# Patient Record
Sex: Male | Born: 1985 | Race: Black or African American | Hispanic: No | Marital: Single | Smoking: Current every day smoker
Health system: Southern US, Community
[De-identification: ages and names within clinical notes are randomized; demographics above are authoritative.]

## PROBLEM LIST (undated history)

## (undated) HISTORY — PX: TONSILLECTOMY: SUR1361

---

## 2011-12-03 ENCOUNTER — Emergency Department (HOSPITAL_BASED_OUTPATIENT_CLINIC_OR_DEPARTMENT_OTHER)
Admission: EM | Admit: 2011-12-03 | Discharge: 2011-12-03 | Disposition: A | Discharge status: Home / Self Care | Payer: Self-pay | Attending: Emergency Medicine | Admitting: Emergency Medicine

## 2011-12-03 ENCOUNTER — Other Ambulatory Visit: Payer: Self-pay

## 2011-12-03 ENCOUNTER — Encounter (HOSPITAL_BASED_OUTPATIENT_CLINIC_OR_DEPARTMENT_OTHER): Payer: Self-pay | Admitting: *Deleted

## 2011-12-03 DIAGNOSIS — R110 Nausea: Secondary | ICD-10-CM | POA: Insufficient documentation

## 2011-12-03 DIAGNOSIS — F17200 Nicotine dependence, unspecified, uncomplicated: Secondary | ICD-10-CM | POA: Insufficient documentation

## 2011-12-03 DIAGNOSIS — R42 Dizziness and giddiness: Secondary | ICD-10-CM | POA: Insufficient documentation

## 2011-12-03 LAB — COMPREHENSIVE METABOLIC PANEL
ALT: 44 U/L (ref 0–53)
Alkaline Phosphatase: 90 U/L (ref 39–117)
CO2: 28 mEq/L (ref 19–32)
Chloride: 101 mEq/L (ref 96–112)
GFR calc Af Amer: >90 mL/min (ref >90)
Glucose, Bld: 98 mg/dL (ref 70–99)
Potassium: 3.8 mEq/L (ref 3.5–5.1)
Sodium: 139 mEq/L (ref 135–145)
Total Bilirubin: 0.3 mg/dL (ref 0.3–1.2)
Total Protein: 7.5 g/dL (ref 6.0–8.3)

## 2011-12-03 LAB — CBC
Hemoglobin: 14.2 g/dL (ref 13.0–17.0)
Platelets: 213 10*3/uL (ref 150–400)
RBC: 4.93 MIL/uL (ref 4.22–5.81)
WBC: 6.1 10*3/uL (ref 4.0–10.5)

## 2011-12-03 LAB — DIFFERENTIAL
Eosinophils Absolute: 0.1 10*3/uL (ref 0.0–0.7)
Lymphocytes Relative: 24 % (ref 12–46)
Lymphs Abs: 1.5 10*3/uL (ref 0.7–4.0)
Neutro Abs: 4 10*3/uL (ref 1.7–7.7)
Neutrophils Relative %: 65 % (ref 43–77)

## 2011-12-03 MED ORDER — ONDANSETRON HCL 4 MG/2ML IJ SOLN
4.0000 mg | Freq: Once | INTRAMUSCULAR | Status: AC
  Administered 2011-12-03: 4 mg via INTRAVENOUS
  Filled 2011-12-03: qty 2

## 2011-12-03 MED ORDER — ONDANSETRON HCL 4 MG PO TABS: 4.0000 mg | ORAL_TABLET | Freq: Four times a day (QID) | ORAL | Status: AC

## 2011-12-03 MED ORDER — SODIUM CHLORIDE 0.9 % IV BOLUS (SEPSIS)
1000.0000 mL | Freq: Once | INTRAVENOUS | Status: AC
  Administered 2011-12-03: 1000 mL via INTRAVENOUS

## 2011-12-03 NOTE — ED Provider Notes (Signed)
History     CSN: [id number]  Arrival date & time 12/03/11  1622   First MD Initiated Contact with Patient 12/03/11 1640      Chief Complaint  Patient presents with  . Dizziness  . Nausea   patient presents with dizziness and nausea for one hour. His girlfriend is currently sick with "stomach flu." She has been having vomiting and diarrhea. All day and left work. However, she did not check in as a patient. Patient states he feels like he wants to followup but has not feels queasy and lightheaded. He has had no diarrhea, no vomiting, no fever, no chest pain, or abdominal pain. Just feels a sensation that he is about to vomit. Denies any other sick contacts other than his girlfriend. He has no significant past medical history no recent travel. Denies any recent change in diet  (Consider location/radiation/quality/duration/timing/severity/associated sxs/prior treatment) HPI  History reviewed. No pertinent past medical history.  Past Surgical History  Procedure Date  . Tonsillectomy     History reviewed. No pertinent family history.  History  Substance Use Topics  . Smoking status: Current Some Day Smoker  . Smokeless tobacco: Not on file  . Alcohol Use: No      Review of Systems  All other systems reviewed and are negative.    Allergies  Review of patient's allergies indicates no known allergies.  Home Medications  No current outpatient prescriptions on file.  BP 143/78  Pulse 102  Temp(Src) 99 F (37.2 C) (Oral)  Resp 18  Ht 5\' 8"  (1.727 m)  Wt 222 lb (100.699 kg)  BMI 33.76 kg/m2  SpO2 96%  Physical Exam  Nursing note and vitals reviewed. Constitutional: He is oriented to person, place, and time. He appears well-developed and well-nourished.  HENT:  Head: Normocephalic and atraumatic.  Eyes: Conjunctivae and EOM are normal. Pupils are equal, round, and reactive to light.  Neck: Neck supple.  Cardiovascular: Normal rate and regular rhythm.  Exam reveals no  gallop and no friction rub.   No murmur heard. Pulmonary/Chest: Breath sounds normal. He has no wheezes. He has no rales. He exhibits no tenderness.  Abdominal: Soft. Bowel sounds are normal. He exhibits no distension. There is no tenderness. There is no rebound and no guarding.  Musculoskeletal: Normal range of motion.  Neurological: He is alert and oriented to person, place, and time. No cranial nerve deficit. Coordination normal.  Skin: Skin is warm and dry. No rash noted.  Psychiatric: He has a normal mood and affect.    ED Course  Procedures (including critical care time)   Labs Reviewed  CBC  DIFFERENTIAL  COMPREHENSIVE METABOLIC PANEL   No results found.   No diagnosis found.    MDM  Patient is seen and examined, initial history and physical is completed. Evaluation initiated      6:43 PM   Date: 12/03/2011  Rate: 88  Rhythm: normal sinus rhythm  QRS Axis: normal  Intervals: normal  ST/T Wave abnormalities: normal  Conduction Disutrbances:none  Narrative Interpretation:   Old EKG Reviewed: none available   Results for orders placed during the hospital encounter of 12/03/11  CBC      Component Value Range   WBC 6.1  4.0 - 10.5 (K/uL)   RBC 4.93  4.22 - 5.81 (MIL/uL)   Hemoglobin 14.2  13.0 - 17.0 (g/dL)   HCT 40.9  81.1 - 91.4 (%)   MCV 82.4  78.0 - 100.0 (fL)   MCH 28.8  26.0 - 34.0 (pg)   MCHC 35.0  30.0 - 36.0 (g/dL)   RDW 16.1  09.6 - 04.5 (%)   Platelets 213  150 - 400 (K/uL)  DIFFERENTIAL      Component Value Range   Neutrophils Relative 65  43 - 77 (%)   Neutro Abs 4.0  1.7 - 7.7 (K/uL)   Lymphocytes Relative 24  12 - 46 (%)   Lymphs Abs 1.5  0.7 - 4.0 (K/uL)   Monocytes Relative 10  3 - 12 (%)   Monocytes Absolute 0.6  0.1 - 1.0 (K/uL)   Eosinophils Relative 1  0 - 5 (%)   Eosinophils Absolute 0.1  0.0 - 0.7 (K/uL)   Basophils Relative 0  0 - 1 (%)   Basophils Absolute 0.0  0.0 - 0.1 (K/uL)  COMPREHENSIVE METABOLIC PANEL       Component Value Range   Sodium 139  135 - 145 (mEq/L)   Potassium 3.8  3.5 - 5.1 (mEq/L)   Chloride 101  96 - 112 (mEq/L)   CO2 28  19 - 32 (mEq/L)   Glucose, Bld 98  70 - 99 (mg/dL)   BUN 10  6 - 23 (mg/dL)   Creatinine, Ser 4.09  0.50 - 1.35 (mg/dL)   Calcium 9.7  8.4 - 81.1 (mg/dL)   Total Protein 7.5  6.0 - 8.3 (g/dL)   Albumin 4.3  3.5 - 5.2 (g/dL)   AST 21  0 - 37 (U/L)   ALT 44  0 - 53 (U/L)   Alkaline Phosphatase 90  39 - 117 (U/L)   Total Bilirubin 0.3  0.3 - 1.2 (mg/dL)   GFR calc non Af Amer >90  >90 (mL/min)   GFR calc Af Amer >90  >90 (mL/min)   No results found.  Stable for dc home.     Finnbar Cedillos A. Patrica Duel, MD 12/03/11 1843

## 2011-12-03 NOTE — ED Notes (Addendum)
Patient states that earlier he had sudden onset of nausea/dizziness. No dizziness at this time, able to walk w/o difficulty, no neuro deficits, still experiencing nausea

## 2011-12-03 NOTE — ED Notes (Signed)
Pt c/o dizzy and nausea x 1 hr

## 2012-12-27 ENCOUNTER — Encounter (HOSPITAL_BASED_OUTPATIENT_CLINIC_OR_DEPARTMENT_OTHER): Payer: Self-pay | Admitting: *Deleted

## 2012-12-27 ENCOUNTER — Emergency Department (HOSPITAL_BASED_OUTPATIENT_CLINIC_OR_DEPARTMENT_OTHER)
Admission: EM | Admit: 2012-12-27 | Discharge: 2012-12-27 | Disposition: A | Discharge status: Home / Self Care | Payer: Self-pay | Attending: Emergency Medicine | Admitting: Emergency Medicine

## 2012-12-27 ENCOUNTER — Emergency Department (HOSPITAL_BASED_OUTPATIENT_CLINIC_OR_DEPARTMENT_OTHER): Payer: Self-pay

## 2012-12-27 DIAGNOSIS — Z87891 Personal history of nicotine dependence: Secondary | ICD-10-CM | POA: Insufficient documentation

## 2012-12-27 DIAGNOSIS — J029 Acute pharyngitis, unspecified: Secondary | ICD-10-CM | POA: Insufficient documentation

## 2012-12-27 DIAGNOSIS — J3489 Other specified disorders of nose and nasal sinuses: Secondary | ICD-10-CM | POA: Insufficient documentation

## 2012-12-27 DIAGNOSIS — R5381 Other malaise: Secondary | ICD-10-CM | POA: Insufficient documentation

## 2012-12-27 DIAGNOSIS — R51 Headache: Secondary | ICD-10-CM | POA: Insufficient documentation

## 2012-12-27 DIAGNOSIS — R109 Unspecified abdominal pain: Secondary | ICD-10-CM | POA: Insufficient documentation

## 2012-12-27 DIAGNOSIS — IMO0001 Reserved for inherently not codable concepts without codable children: Secondary | ICD-10-CM | POA: Insufficient documentation

## 2012-12-27 DIAGNOSIS — R52 Pain, unspecified: Secondary | ICD-10-CM | POA: Insufficient documentation

## 2012-12-27 MED ORDER — HYDROCODONE-HOMATROPINE 5-1.5 MG/5ML PO SYRP: 5.0000 mL | ORAL_SOLUTION | Freq: Four times a day (QID) | ORAL | Status: AC | PRN

## 2012-12-27 NOTE — ED Notes (Signed)
Cough, headache, body aches x 2 days.

## 2012-12-27 NOTE — ED Notes (Signed)
MD at bedside. 

## 2012-12-27 NOTE — ED Provider Notes (Signed)
History  This chart was scribed for Gerhard Munch, MD by Bennett Scrape, ED Scribe. This patient was seen in room MH10/MH10 and the patient's care was started at 8:27 PM.  CSN: [id number]  Arrival date & time 12/27/12  1945   First MD Initiated Contact with Patient 12/27/12 2027      Chief Complaint  Patient presents with  . Cough     The history is provided by the patient. No language interpreter was used.    Zachary Skinner is a 27 y.o. male who presents to the Emergency Department complaining of 5 days of gradual onset, gradually worsening, constant sore throat with associated intermittent HAs, non-productive cough, myalgias, fatigue and lower abdominal pain. He reports taking Nyquil and Robitussin with mild improvement. He denies any sick contacts with similar symptoms. He denies SOB, fevers, chills, nausea, emesis, diarrhea, confusion, leg swelling, urinary symptoms and rash as associated symptoms. He does not have a h/o chronic medical conditions and is a former smoker but denies alcohol use.     History reviewed. No pertinent past medical history.  Past Surgical History  Procedure Laterality Date  . Tonsillectomy      No family history on file.  History  Substance Use Topics  . Smoking status: Former Games developer  . Smokeless tobacco: Not on file  . Alcohol Use: No      Review of Systems  Constitutional:       Per HPI, otherwise negative  HENT:       Per HPI, otherwise negative  Respiratory:       Per HPI, otherwise negative  Cardiovascular:       Per HPI, otherwise negative  Gastrointestinal: Positive for abdominal pain. Negative for nausea, vomiting and diarrhea.  Endocrine:       Negative aside from HPI  Genitourinary:       Neg aside from HPI   Musculoskeletal:       Per HPI, otherwise negative  Skin: Negative.   Neurological: Positive for headaches. Negative for syncope.    Allergies  Review of patient's allergies indicates no known  allergies.  Home Medications  No current outpatient prescriptions on file.  Triage Vitals: BP 125/80  Pulse 71  Temp(Src) 98.9 F (37.2 C) (Oral)  Resp 22  SpO2 99%  Physical Exam  Nursing note and vitals reviewed. Constitutional: He is oriented to person, place, and time. He appears well-developed. No distress.  HENT:  Head: Normocephalic and atraumatic.  Mouth/Throat: Oropharynx is clear and moist.  Eyes: Conjunctivae and EOM are normal.  Cardiovascular: Normal rate and regular rhythm.   Pulmonary/Chest: Effort normal. No stridor. No respiratory distress.  Mildly diminished breath sounds on the right  Abdominal: He exhibits no distension.  Musculoskeletal: He exhibits no edema.  Lymphadenopathy:    He has no cervical adenopathy.  Neurological: He is alert and oriented to person, place, and time.  Skin: Skin is warm and dry.  Psychiatric: He has a normal mood and affect.    ED Course  Procedures (including critical care time)  DIAGNOSTIC STUDIES: Oxygen Saturation is 99% on room air, normal by my interpretation.    COORDINATION OF CARE: 8:42 PM-Advised pt that his symptoms are mostly likely viral and had a lengthy discussion with the pt as to why Theraflu would not apply in his case. Discussed treatment plan which includes CXR with pt at bedside and pt agreed to plan.   Labs Reviewed - No data to display No results found.  No diagnosis found.  I interpreted the x-ray, which is unremarkable.  MDM   I personally performed the services described in this documentation, which was scribed in my presence. The recorded information has been reviewed and is accurate.   This young male presents with several days of cough, congestion, generalized discomfort.  On exam the patient is afebrile, with unremarkable vital signs, little suspicion for acute systemic illness.  The patient's x-ray does not demonstrate pneumonia, and although he may have an influenza-like illness, the  passage of time since that his symptoms did not make him a candidate for antiviral.  His discharge stable condition after a discussion on return precautions.  Gerhard Munch, MD 12/27/12 2141

## 2015-01-11 ENCOUNTER — Encounter (HOSPITAL_BASED_OUTPATIENT_CLINIC_OR_DEPARTMENT_OTHER): Payer: Self-pay | Admitting: Emergency Medicine

## 2015-01-11 ENCOUNTER — Emergency Department (HOSPITAL_BASED_OUTPATIENT_CLINIC_OR_DEPARTMENT_OTHER)
Admission: EM | Admit: 2015-01-11 | Discharge: 2015-01-11 | Disposition: A | Discharge status: Home / Self Care | Payer: Managed Care, Other (non HMO) | Attending: Emergency Medicine | Admitting: Emergency Medicine

## 2015-01-11 DIAGNOSIS — H1131 Conjunctival hemorrhage, right eye: Secondary | ICD-10-CM | POA: Insufficient documentation

## 2015-01-11 DIAGNOSIS — Z862 Personal history of diseases of the blood and blood-forming organs and certain disorders involving the immune mechanism: Secondary | ICD-10-CM | POA: Insufficient documentation

## 2015-01-11 DIAGNOSIS — Z87891 Personal history of nicotine dependence: Secondary | ICD-10-CM | POA: Insufficient documentation

## 2015-01-11 NOTE — Discharge Instructions (Signed)
Subconjunctival Hemorrhage °A subconjunctival hemorrhage is a bright red patch covering a portion of the white of the eye. The white part of the eye is called the sclera, and it is covered by a thin membrane called the conjunctiva. This membrane is clear, except for tiny blood vessels that you can see with the naked eye. When your eye is irritated or inflamed and becomes red, it is because the vessels in the conjunctiva are swollen. °Sometimes, a blood vessel in the conjunctiva can break and bleed. When this occurs, the blood builds up between the conjunctiva and the sclera, and spreads out to create a red area. The red spot may be very small at first. It may then spread to cover a larger part of the surface of the eye, or even all of the visible white part of the eye. °In almost all cases, the blood will go away and the eye will become white again. Before completely dissolving, however, the red area may spread. It may also become brownish-yellow in color before going away. If a lot of blood collects under the conjunctiva, it may look like a bulge on the surface of the eye. This looks scary, but it will also eventually flatten out and go away. Subconjunctival hemorrhages do not cause pain, but if swollen, may cause a feeling of irritation. There is no effect on vision.  °CAUSES  °· The most common cause is mild trauma (rubbing the eye, irritation). °· Subconjunctival hemorrhages can happen because of coughing or straining (lifting heavy objects), vomiting, or sneezing. °· In some cases, your doctor may want to check your blood pressure. High blood pressure can also cause a subconjunctival hemorrhage. °· Severe trauma or blunt injuries. °· Diseases that affect blood clotting (hemophilia, leukemia). °· Abnormalities of blood vessels behind the eye (carotid cavernous sinus fistula). °· Tumors behind the eye. °· Certain drugs (aspirin, Coumadin, heparin). °· Recent eye surgery. °HOME CARE INSTRUCTIONS  °· Do not worry  about the appearance of your eye. You may continue your usual activities. °· Often, follow-up is not necessary. °SEEK MEDICAL CARE IF:  °· Your eye becomes painful. °· The bleeding does not disappear within 3 weeks. °· Bleeding occurs elsewhere, for example, under the skin, in the mouth, or in the other eye. °· You have recurring subconjunctival hemorrhages. °SEEK IMMEDIATE MEDICAL CARE IF:  °· Your vision changes or you have difficulty seeing. °· You develop a severe headache, persistent vomiting, confusion, or abnormal drowsiness (lethargy). °· Your eye seems to bulge or protrude from the eye socket. °· You notice the sudden appearance of bruises or have spontaneous bleeding elsewhere on your body. °Document Released: 10/27/2005 Document Revised: 03/13/2014 Document Reviewed: 09/24/2009 °[employer]® Patient Information ©2015 [employer]. This information is not intended to replace advice given to you by your health care provider. Make sure you discuss any questions you have with your health care provider. ° °

## 2015-01-11 NOTE — ED Notes (Signed)
Pt c/o right eye redness. States he woke up with the eye being red. Denies drainage or injury and denies Pain. Pt a/o NAD at triage.

## 2015-01-11 NOTE — ED Provider Notes (Signed)
CSN: [id number]     Arrival date & time 01/11/15  1306 History   First MD Initiated Contact with Patient 01/11/15 1503     Chief Complaint  Patient presents with  . Eye Problem     (Consider location/radiation/quality/duration/timing/severity/associated sxs/prior Treatment) HPI  29 year old male presents with right eye redness that he knows when he woke up this morning. His pain, blurry vision, tearing, or swelling associated with this. He states the right redness of the red has decreased throughout the day. The patient has a history of sickle cell trait but no specific sickle cell anemia. He denies any recent cough or infection. He does not wear contacts or glasses. 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 Yesterday he does remember straining particularly hard last night. Denies any eye pain or symptoms at the time.  History reviewed. No pertinent past medical history. Past Surgical History  Procedure Laterality Date  . Tonsillectomy     No family history on file. History  Substance Use Topics  . Smoking status: Former Games developermoker  . Smokeless tobacco: Not on file  . Alcohol Use: No    Review of Systems  Eyes: Positive for redness. Negative for photophobia, pain, discharge, itching and visual disturbance.  Respiratory: Negative for cough.   Neurological: Negative for headaches.  All other systems reviewed and are negative.     Allergies  Review of patient's allergies indicates no known allergies.  Home Medications   Prior to Admission medications   Medication Sig Start Date End Date Taking? Authorizing Provider  HYDROcodone-homatropine (HYCODAN) 5-1.5 MG/5ML syrup Take 5 mLs by mouth every 6 (six) hours as needed for cough. 12/27/12   Gerhard Munchobert Lockwood, MD   BP 134/90 mmHg  Pulse 73  Temp(Src) 98.8 F (37.1 C) (Oral)  Resp 18  Ht 5\' 8"  (1.727 m)  Wt 240 lb (108.863 kg)  BMI 36.50 kg/m2  SpO2 100% Physical Exam    Constitutional: He is oriented to person, place, and time. He appears well-developed and well-nourished.  HENT:  Head: Normocephalic and atraumatic.  Right Ear: External ear normal.  Left Ear: External ear normal.  Nose: Nose normal.  Eyes: EOM and lids are normal. Pupils are equal, round, and reactive to light. Right eye exhibits no discharge. Left eye exhibits no discharge. Right conjunctiva has a hemorrhage. No scleral icterus.    Cardiovascular: Normal rate, regular rhythm, normal heart sounds and intact distal pulses.   Pulmonary/Chest: Effort normal.  Neurological: He is alert and oriented to person, place, and time.  Skin: Skin is warm and dry.  Nursing note and vitals reviewed.   ED Course  Procedures (including critical care time) Labs Review Labs Reviewed - No data to display  Imaging Review No results found.   EKG Interpretation None      MDM   Final diagnoses:  Subconjunctival hemorrhage of right eye    Patient appears to have a simple subconjunctival hemorrhage. This is likely from straining while lifting heavy boxes last night at work. No blurry vision or other abnormalities on exam. No trauma to the eye. At this point discussed expectant management and follow-up as needed with PCP.    Audree CamelScott T Kazimierz Springborn, MD 01/11/15 [phone]

## 2019-04-03 ENCOUNTER — Emergency Department (HOSPITAL_BASED_OUTPATIENT_CLINIC_OR_DEPARTMENT_OTHER): Payer: Managed Care, Other (non HMO)

## 2019-04-03 ENCOUNTER — Emergency Department (HOSPITAL_BASED_OUTPATIENT_CLINIC_OR_DEPARTMENT_OTHER)
Admission: EM | Admit: 2019-04-03 | Discharge: 2019-04-03 | Disposition: A | Discharge status: Home / Self Care | Payer: Managed Care, Other (non HMO) | Attending: Emergency Medicine | Admitting: Emergency Medicine

## 2019-04-03 ENCOUNTER — Other Ambulatory Visit: Payer: Self-pay

## 2019-04-03 ENCOUNTER — Encounter (HOSPITAL_BASED_OUTPATIENT_CLINIC_OR_DEPARTMENT_OTHER): Payer: Self-pay | Admitting: *Deleted

## 2019-04-03 DIAGNOSIS — Y93E6 Activity, residential relocation: Secondary | ICD-10-CM | POA: Insufficient documentation

## 2019-04-03 DIAGNOSIS — F17200 Nicotine dependence, unspecified, uncomplicated: Secondary | ICD-10-CM | POA: Insufficient documentation

## 2019-04-03 DIAGNOSIS — Y929 Unspecified place or not applicable: Secondary | ICD-10-CM | POA: Insufficient documentation

## 2019-04-03 DIAGNOSIS — Y999 Unspecified external cause status: Secondary | ICD-10-CM | POA: Insufficient documentation

## 2019-04-03 DIAGNOSIS — M25572 Pain in left ankle and joints of left foot: Secondary | ICD-10-CM

## 2019-04-03 DIAGNOSIS — X500XXA Overexertion from strenuous movement or load, initial encounter: Secondary | ICD-10-CM | POA: Insufficient documentation

## 2019-04-03 NOTE — ED Provider Notes (Signed)
[facility] EMERGENCY DEPARTMENT Provider Note   CSN: [id number] Arrival date & time: 04/03/19  1956    History   Chief Complaint Chief Complaint  Patient presents with  . Ankle Pain    HPI Zachary Skinner is a 33 y.o. male with no significant past medical history who presents today for evaluation of left ankle pain.  He reports that yesterday he was helping a neighbor move when he was walking on a ramp and hit the stepped wrong twisting his left ankle.  He reports that yesterday the ankle was significantly swollen and bruised.  He has been trying compression with Ace wrap, ice, and elevation at home and the bruising is gone down however he is still having difficulty bearing weight due to pain.  He denies any other injuries.  No pain around the knee.  He did not strike his head.     HPI  History reviewed. No pertinent past medical history.  There are no active problems to display for this patient.   Past Surgical History:  Procedure Laterality Date  . TONSILLECTOMY          Home Medications    Prior to Admission medications   Medication Sig Start Date End Date Taking? Authorizing Provider  HYDROcodone-homatropine (HYCODAN) 5-1.5 MG/5ML syrup Take 5 mLs by mouth every 6 (six) hours as needed for cough. 12/27/12   Gerhard Munch, MD    Family History No family history on file.  Social History Social History   Tobacco Use  . Smoking status: Current Every Day Smoker  . Smokeless tobacco: Never Used  Substance Use Topics  . Alcohol use: No  . Drug use: Yes    Types: Marijuana     Allergies   Patient has no known allergies.   Review of Systems Review of Systems  Constitutional: Negative for chills and fever.  Musculoskeletal: Positive for arthralgias, gait problem (due to pain) and joint swelling.  Neurological: Negative for weakness and headaches.  All other systems reviewed and are negative.    Physical Exam Updated Vital Signs BP  128/80 (BP Location: Right Arm)   Pulse 75   Temp 98.7 F (37.1 C) (Oral)   Resp 20   Ht 5\' 8"  (1.727 m)   Wt 120.2 kg   SpO2 96%   BMI 40.29 kg/m   Physical Exam Vitals signs and nursing note reviewed.  HENT:     Head: Normocephalic.  Cardiovascular:     Pulses: Normal pulses.     Comments: 2+ DP pulses bilaterally.  Unable to palpate left PT pulse secondary to pain when pressing in that area.  It is warm with brisk capillary refill. Musculoskeletal:     Comments: Left Lower extremity: There is generalized tenderness to palpation around the left ankle primarily on the lateral aspect anterior to the malleolus, across the anterior joint line.  There is also tenderness to palpation in the left foot below the medial malleolus.  There is generalized edema around the left ankle.  Left leg does not have any proximal lower leg tenderness to palpation.  Compartments are soft and easily compressible.  Skin:    General: Skin is warm and dry.     Comments: No wounds or abrasions over left ankle   Neurological:     General: No focal deficit present.     Mental Status: He is alert.     Sensory: No sensory deficit.  Psychiatric:        Mood and Affect:  Mood normal.        Behavior: Behavior normal.      ED Treatments / Results  Labs (all labs ordered are listed, but only abnormal results are displayed) Labs Reviewed - No data to display  EKG None  Radiology Dg Ankle Complete Left  Result Date: 04/03/2019 CLINICAL DATA:  Pain status post fall EXAM: LEFT ANKLE COMPLETE - 3+ VIEW COMPARISON:  None. FINDINGS: There is no acute displaced fracture or dislocation. There is soft tissue swelling about the ankle. An ankle joint effusion is noted. IMPRESSION: No acute osseous abnormality. Electronically Signed   By: Katherine Mantlehristopher  Green M.D.   On: 04/03/2019 20:31   Dg Foot Complete Left  Result Date: 04/03/2019 CLINICAL DATA:  Pain and swelling status post fall EXAM: LEFT FOOT - COMPLETE 3+  VIEW COMPARISON:  None. FINDINGS: There is no acute displaced fracture or dislocation involving the left foot. There is soft tissue swelling about the foot. Ankle joint effusion is noted. IMPRESSION: No acute displaced fracture or dislocation involving the left foot. Electronically Signed   By: Katherine Mantlehristopher  Green M.D.   On: 04/03/2019 20:29    Procedures Procedures (including critical care time)  Medications Ordered in ED Medications - No data to display   Initial Impression / Assessment and Plan / ED Course  I have reviewed the triage vital signs and the nursing notes.  Pertinent labs & imaging results that were available during my care of the patient were reviewed by me and considered in my medical decision making (see chart for details).       Zachary Skinner Presents with left ankle pain after rolling his ankle consistent with an ankle sprain/strain.  The affected ankle has mild edema and is tender on the lateral aspect.  X-rays were obtained with out acute abnormality. The skin is intact to ankle/foot.  The foot is warm and well perfused with intact sensation.  Motor function is limited secondary to pain.  Patient given instructions for OTC pain medication, crutches.  Was offered brace but declined.  Patient advised to follow up with PCP if symptoms persist for longer than one week.  Patient was given the option to ask questions, all of which were answered to the best of my ability.  Patient is agreeable for discharge.     Final Clinical Impressions(s) / ED Diagnoses   Final diagnoses:  Acute left ankle pain    ED Discharge Orders    None       Norman ClayHammond, Raini Tiley W, PA-C 04/04/19 0038    Rolan BuccoBelfi, Melanie, MD 04/04/19 1734

## 2019-04-03 NOTE — ED Triage Notes (Signed)
Pt reports he twisted his left ankle yesterday when his foot slipped on a ramp

## 2019-04-03 NOTE — Discharge Instructions (Signed)

## 2019-04-03 NOTE — ED Notes (Signed)
ED Provider at bedside. 

## 2020-10-29 IMAGING — DX LEFT ANKLE COMPLETE - 3+ VIEW
3 series · 3 of 3 positions shown · non-contrast
Comparison: None.

CLINICAL DATA: Pain status post fall

EXAM:
LEFT ANKLE COMPLETE - 3+ VIEW

[ankle ap]
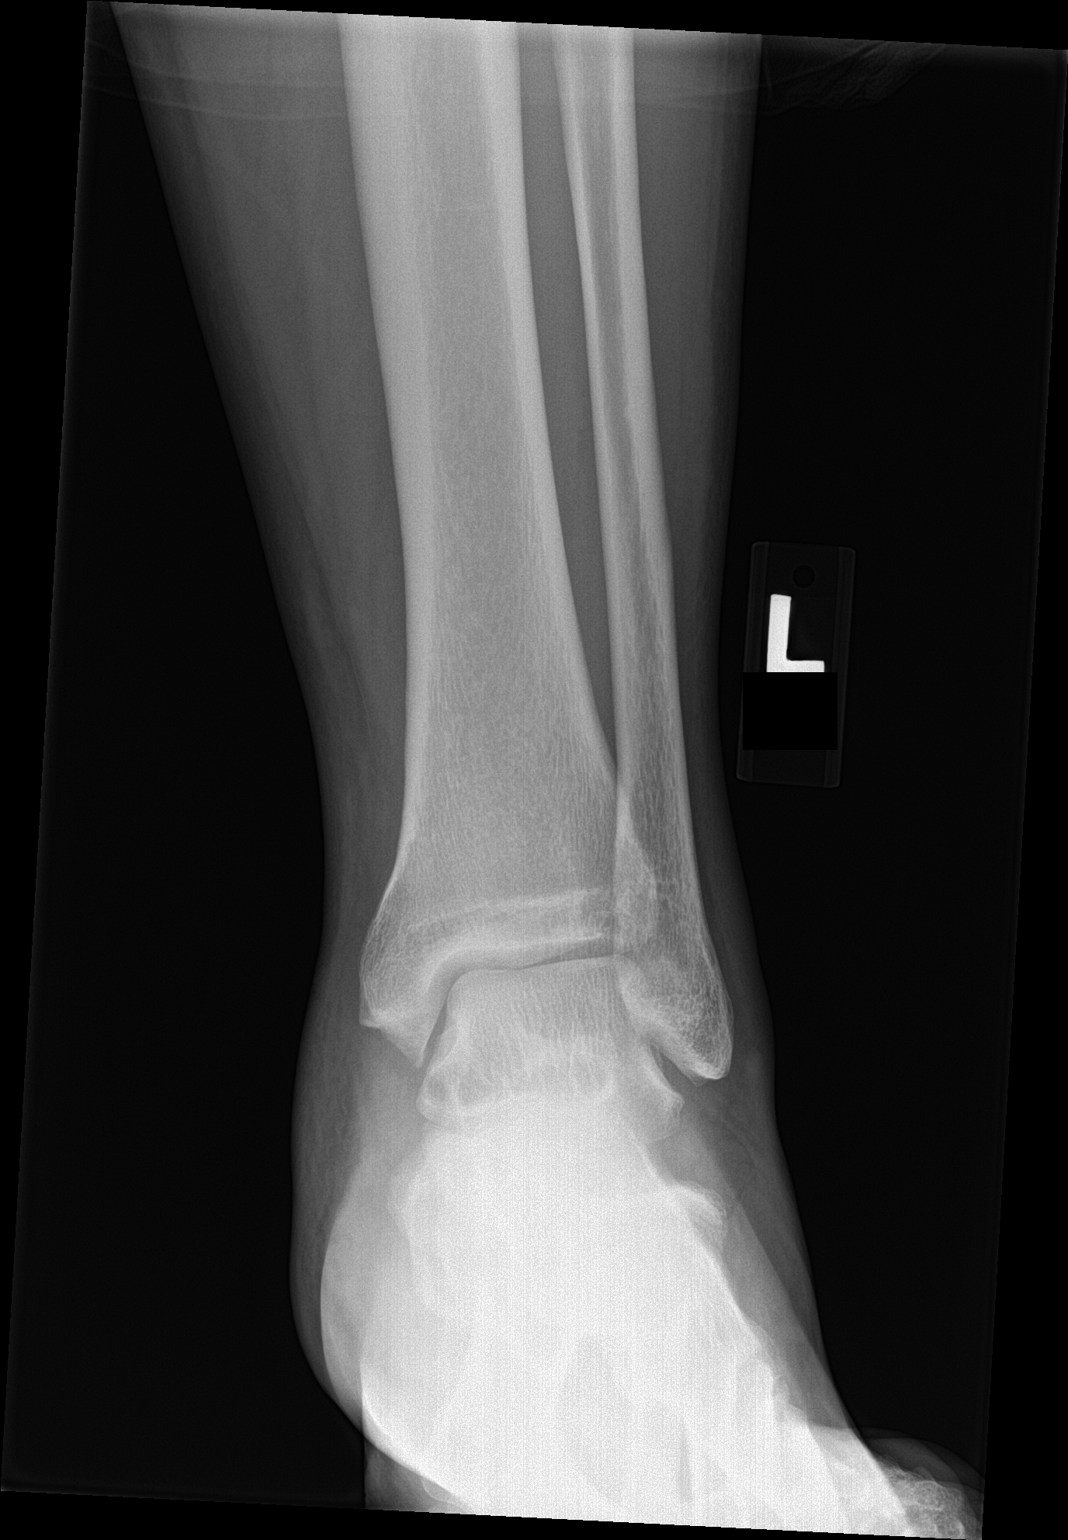

[ankle obl]
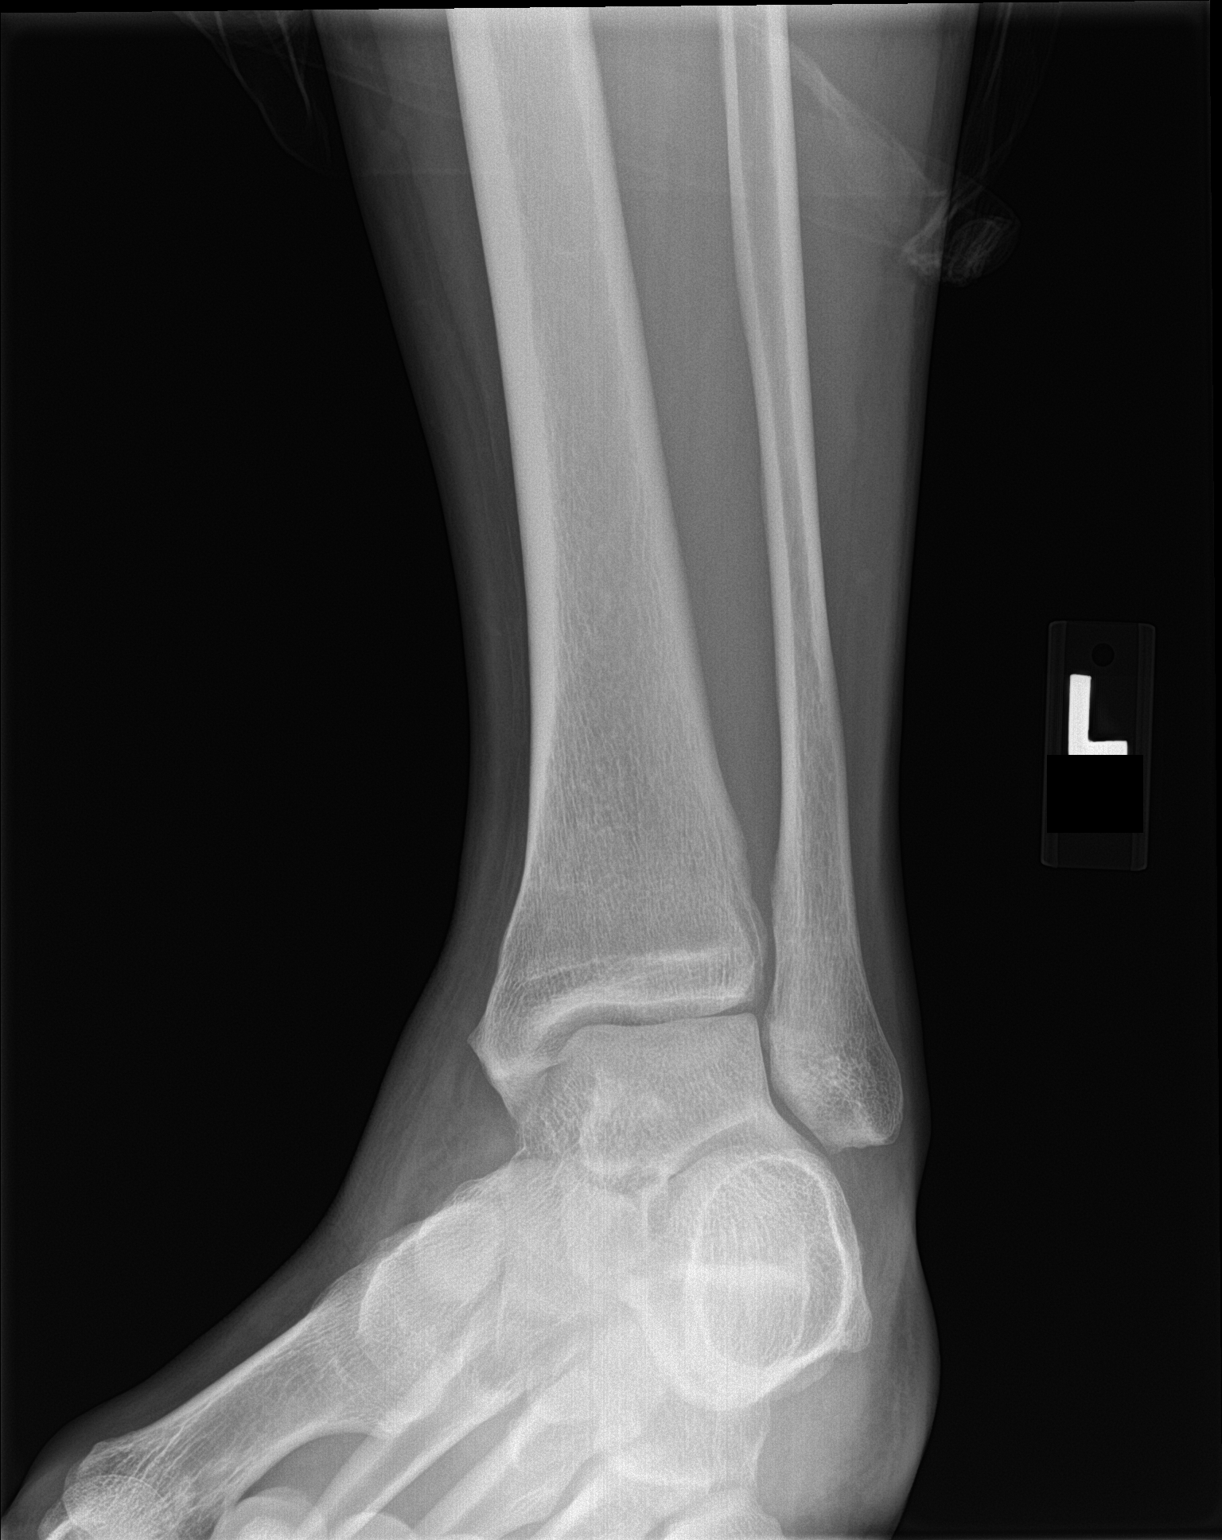

[ankle lat]
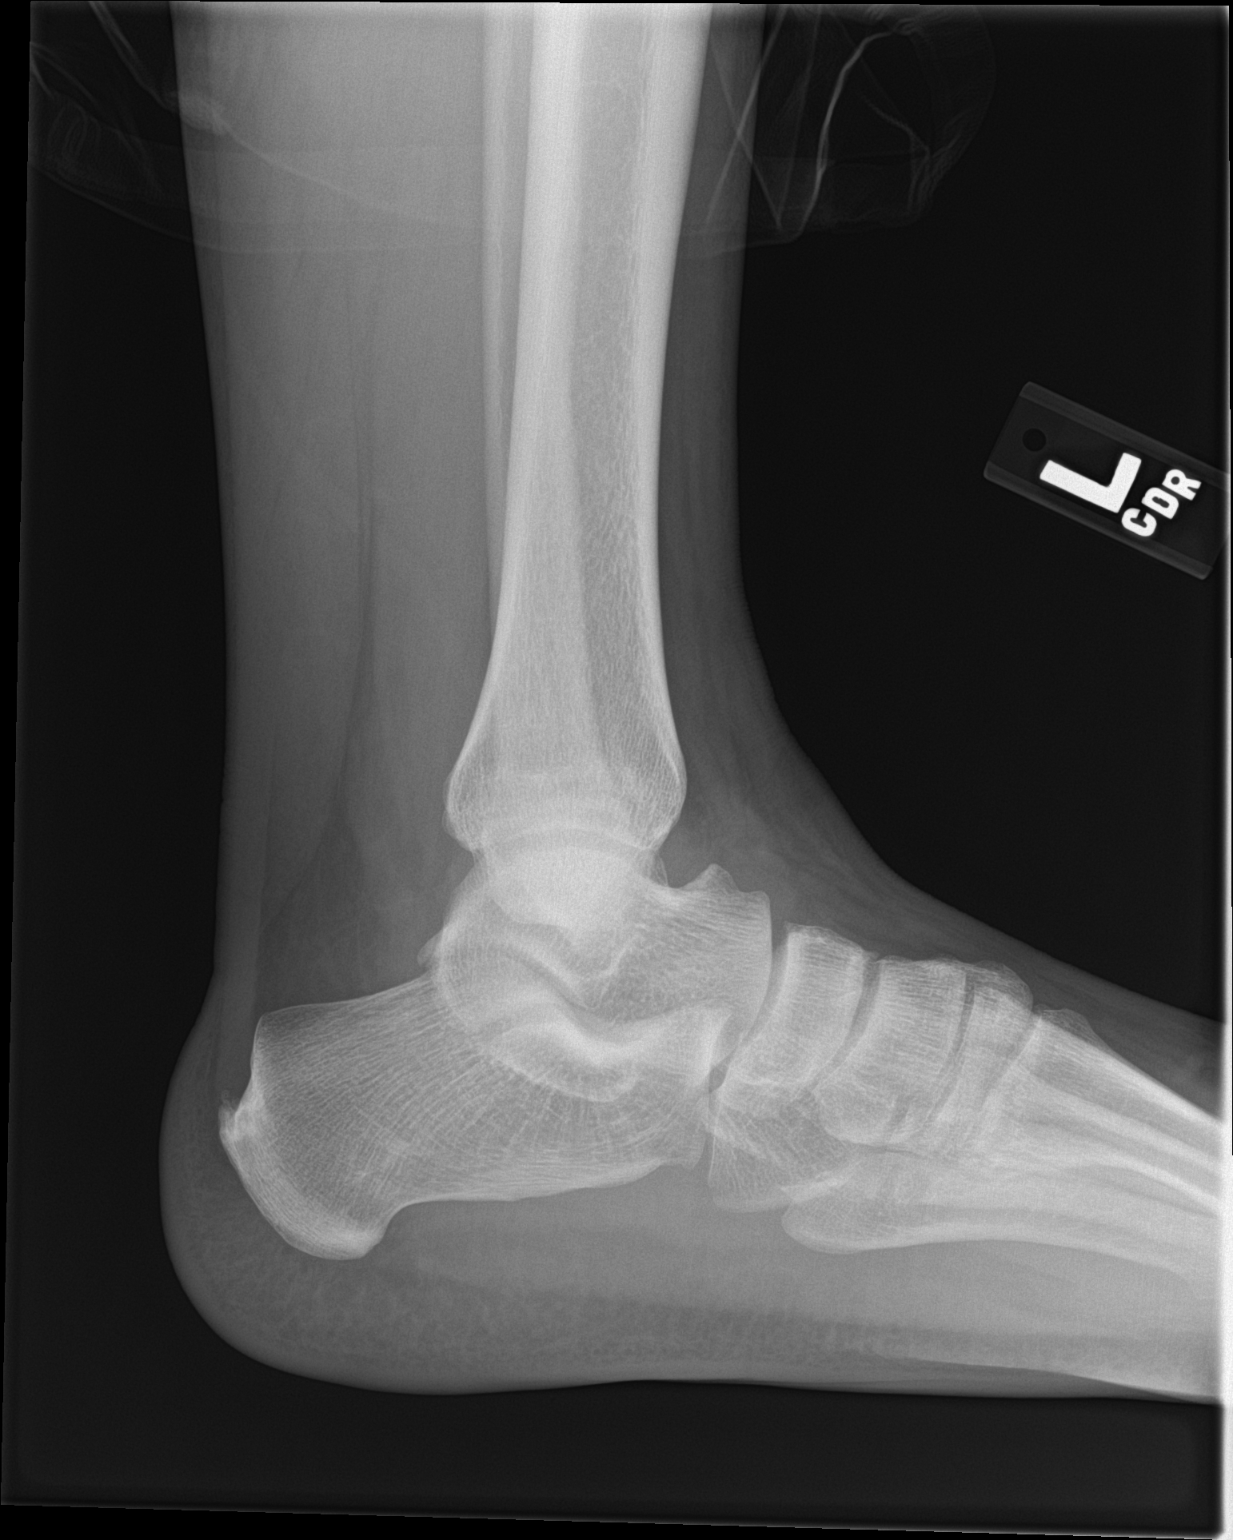

[3 of 3 positions shown; findings below may reference images not displayed]

FINDINGS: There is no acute displaced fracture or dislocation. There is soft
tissue swelling about the ankle. An ankle joint effusion is noted.
IMPRESSION: No acute osseous abnormality.
